# Patient Record
Sex: Male | Born: 1990 | Race: White | Hispanic: No | Marital: Single | Smoking: Current some day smoker
Health system: Southern US, Community
[De-identification: ages and names within clinical notes are randomized; demographics above are authoritative.]

## PROBLEM LIST (undated history)

## (undated) DIAGNOSIS — M7542 Impingement syndrome of left shoulder: Secondary | ICD-10-CM

## (undated) DIAGNOSIS — K219 Gastro-esophageal reflux disease without esophagitis: Secondary | ICD-10-CM

## (undated) DIAGNOSIS — S43005A Unspecified dislocation of left shoulder joint, initial encounter: Secondary | ICD-10-CM

## (undated) DIAGNOSIS — M24119 Other articular cartilage disorders, unspecified shoulder: Secondary | ICD-10-CM

## (undated) HISTORY — PX: UPPER GI ENDOSCOPY: SHX6162

---

## 2013-04-11 ENCOUNTER — Encounter: Payer: Self-pay | Admitting: Sports Medicine

## 2013-04-11 ENCOUNTER — Ambulatory Visit (INDEPENDENT_AMBULATORY_CARE_PROVIDER_SITE_OTHER): Payer: BC Managed Care – PPO | Admitting: Sports Medicine

## 2013-04-11 ENCOUNTER — Ambulatory Visit
Admission: RE | Admit: 2013-04-11 | Discharge: 2013-04-11 | Disposition: A | Discharge status: Home / Self Care | Payer: BC Managed Care – PPO | Source: Ambulatory Visit | Attending: Sports Medicine | Admitting: Sports Medicine

## 2013-04-11 VITALS — BP 118/72 | Ht 73.0 in | Wt 203.0 lb

## 2013-04-11 DIAGNOSIS — M25519 Pain in unspecified shoulder: Secondary | ICD-10-CM

## 2013-04-11 DIAGNOSIS — S43002A Unspecified subluxation of left shoulder joint, initial encounter: Secondary | ICD-10-CM

## 2013-04-11 DIAGNOSIS — S43006A Unspecified dislocation of unspecified shoulder joint, initial encounter: Secondary | ICD-10-CM

## 2013-04-11 DIAGNOSIS — M25512 Pain in left shoulder: Secondary | ICD-10-CM

## 2013-04-11 NOTE — Patient Instructions (Signed)
PRIOR AUTH NUMBER IS [id number]

## 2013-04-11 NOTE — Progress Notes (Signed)
   Subjective:    Patient ID: Randall Mccullough, male    DOB: 12/30/1990, 23 y.o.   MRN: [id number]  HPI chief complaint: Left shoulder pain  Very pleasant right-hand-dominant 23 year old male comes in today complaining of 1 1/2 months of left shoulder pain. He injured the shoulder while lifting weights. While doing an overhead press he felt his left shoulder sublux inferiorly. Spontaneous relocation. Since then, he's had pain with activity, particularly with weight lifting. Pain at night as well. No locking or catching. No numbness or tingling. He has had several previous subluxations in the same shoulder. No prior shoulder surgeries but he did suffer a sternoclavicular subluxation of this same shoulder previously. No history of subluxations or dislocations of the right shoulder. He takes an occasional over-the-counter pain medication. He has not had any imaging. He has not tried physical therapy. He has not been prescribed any specific medicines for this problem. He is referred by his primary care physician for workup and treatment.  Otherwise healthy He is allergic to sulfa drug No chronic medications Socially, drinks alcohol on occasion and works as a Systems analystpersonal trainer for [employer].    Review of Systems As above.     Objective:   Physical Exam Well-developed, fit-appearing. No acute distress. Awake alert oriented x3. Vital signs are reviewed.  Left shoulder: Full range of motion. No tenderness along the clavicle or over the a.c. Joint. Mild prominence of the Emmons joint but nontender to palpation. No tenderness over the bicipital groove. Rotator cuff strength is 5/5. Negative empty can, negative Hawkins. Positive O'Brien's, positive apprehension. Mildly positive sulcus. Neurovascularly intact distally.  Right shoulder: Full range of motion. No tenderness along the clavicle or over the a.c. joint. Rotator cuff strength is 5/5. Negative O'Brien, negative apprehension. Neurovascularly intact  distally.  X-rays of the left shoulder including an axillary view are reviewed. No evidence of a bony Bankart or Hill-Sachs deformity. Films are unremarkable.       Assessment & Plan:  Left shoulder pain secondary to reoccurring subluxations-rule out labral tear  I discussed the patient's treatment options with him. We will pursue an MRI arthrogram of the left shoulder specifically to evaluate for any labral pathology which may need operative intervention. I will call him with those results once available at which point we will delineate further treatment. In the meantime, he needs to refrain from heavy overhead lifting.

## 2013-04-23 ENCOUNTER — Ambulatory Visit
Admission: RE | Admit: 2013-04-23 | Discharge: 2013-04-23 | Disposition: A | Discharge status: Home / Self Care | Payer: BC Managed Care – PPO | Source: Ambulatory Visit | Attending: Sports Medicine | Admitting: Sports Medicine

## 2013-04-23 DIAGNOSIS — M25512 Pain in left shoulder: Secondary | ICD-10-CM

## 2013-04-23 MED ORDER — IOHEXOL 180 MG/ML  SOLN
15.0000 mL | Freq: Once | INTRAMUSCULAR | Status: AC | PRN
  Administered 2013-04-23: 15 mL via INTRA_ARTICULAR

## 2013-04-24 ENCOUNTER — Telehealth: Payer: Self-pay | Admitting: Sports Medicine

## 2013-04-24 ENCOUNTER — Encounter: Payer: Self-pay | Admitting: *Deleted

## 2013-04-24 NOTE — Telephone Encounter (Signed)
I spoke with the patient on the phone today regarding MRI arthrogram findings of his left shoulder. MRI arthrogram shows evidence of an anterior inferior labral tear with partial detachment along with a full-thickness delamination injury involving the adjacent articular cartilage (GLAD lesion). Rotator cuff is intact. He also has evidence of posttraumatic AC joint arthropathy. Based on these findings I recommended surgical consultation with Dr. Dion SaucierLandau to discuss surgical options. Patient is in agreement. I'll defer further workup and treatment to the discretion of Dr. Dion SaucierLandau and the patient will followup with me when necessary.

## 2013-04-24 NOTE — Patient Instructions (Signed)
DR LANDAU MON APR 13TH AT 9A [address] [facility] [phone]

## 2013-06-17 ENCOUNTER — Other Ambulatory Visit: Payer: Self-pay | Admitting: Orthopedic Surgery

## 2013-06-17 DIAGNOSIS — M24119 Other articular cartilage disorders, unspecified shoulder: Secondary | ICD-10-CM

## 2013-06-17 DIAGNOSIS — M7542 Impingement syndrome of left shoulder: Secondary | ICD-10-CM

## 2013-06-17 HISTORY — DX: Other articular cartilage disorders, unspecified shoulder: M24.119

## 2013-06-17 HISTORY — DX: Impingement syndrome of left shoulder: M75.42

## 2013-06-24 ENCOUNTER — Encounter (HOSPITAL_BASED_OUTPATIENT_CLINIC_OR_DEPARTMENT_OTHER): Payer: Self-pay | Admitting: *Deleted

## 2013-06-28 ENCOUNTER — Encounter (HOSPITAL_BASED_OUTPATIENT_CLINIC_OR_DEPARTMENT_OTHER): Payer: Self-pay | Admitting: Anesthesiology

## 2013-06-28 ENCOUNTER — Ambulatory Visit (HOSPITAL_BASED_OUTPATIENT_CLINIC_OR_DEPARTMENT_OTHER)
Admission: RE | Admit: 2013-06-28 | Discharge: 2013-06-28 | Disposition: A | Discharge status: Home / Self Care | Payer: BC Managed Care – PPO | Source: Ambulatory Visit | Attending: Orthopedic Surgery | Admitting: Orthopedic Surgery

## 2013-06-28 ENCOUNTER — Encounter (HOSPITAL_BASED_OUTPATIENT_CLINIC_OR_DEPARTMENT_OTHER): Payer: BC Managed Care – PPO | Admitting: Anesthesiology

## 2013-06-28 ENCOUNTER — Ambulatory Visit (HOSPITAL_BASED_OUTPATIENT_CLINIC_OR_DEPARTMENT_OTHER): Payer: BC Managed Care – PPO | Admitting: Anesthesiology

## 2013-06-28 ENCOUNTER — Encounter (HOSPITAL_BASED_OUTPATIENT_CLINIC_OR_DEPARTMENT_OTHER)
Admission: RE | Disposition: A | Discharge status: Home / Self Care | Payer: Self-pay | Source: Ambulatory Visit | Attending: Orthopedic Surgery

## 2013-06-28 DIAGNOSIS — S43005A Unspecified dislocation of left shoulder joint, initial encounter: Secondary | ICD-10-CM | POA: Diagnosis present

## 2013-06-28 DIAGNOSIS — M19019 Primary osteoarthritis, unspecified shoulder: Secondary | ICD-10-CM | POA: Diagnosis present

## 2013-06-28 DIAGNOSIS — F17200 Nicotine dependence, unspecified, uncomplicated: Secondary | ICD-10-CM | POA: Insufficient documentation

## 2013-06-28 DIAGNOSIS — M249 Joint derangement, unspecified: Secondary | ICD-10-CM | POA: Insufficient documentation

## 2013-06-28 HISTORY — DX: Unspecified dislocation of left shoulder joint, initial encounter: S43.005A

## 2013-06-28 HISTORY — DX: Other articular cartilage disorders, unspecified shoulder: M24.119

## 2013-06-28 HISTORY — DX: Impingement syndrome of left shoulder: M75.42

## 2013-06-28 HISTORY — DX: Gastro-esophageal reflux disease without esophagitis: K21.9

## 2013-06-28 LAB — POCT HEMOGLOBIN-HEMACUE: HEMOGLOBIN: 14.9 g/dL (ref 13.0–17.0)

## 2013-06-28 SURGERY — SHOULDER ATHROSCOPY WITH CAPSULORRHAPHY
Anesthesia: Regional | Site: Shoulder | Laterality: Left

## 2013-06-28 MED ORDER — SENNA-DOCUSATE SODIUM 8.6-50 MG PO TABS: 2.0000 | ORAL_TABLET | Freq: Every day | ORAL | Status: AC

## 2013-06-28 MED ORDER — ONDANSETRON HCL 4 MG/2ML IJ SOLN
INTRAMUSCULAR | Status: DC | PRN
  Administered 2013-06-28: 4 mg via INTRAVENOUS

## 2013-06-28 MED ORDER — DEXAMETHASONE SODIUM PHOSPHATE 4 MG/ML IJ SOLN
INTRAMUSCULAR | Status: DC | PRN
  Administered 2013-06-28: 10 mg via INTRAVENOUS

## 2013-06-28 MED ORDER — MIDAZOLAM HCL 2 MG/2ML IJ SOLN
INTRAMUSCULAR | Status: AC
  Filled 2013-06-28: qty 2

## 2013-06-28 MED ORDER — FENTANYL CITRATE 0.05 MG/ML IJ SOLN
INTRAMUSCULAR | Status: DC | PRN
  Administered 2013-06-28 (×2): 25 ug via INTRAVENOUS

## 2013-06-28 MED ORDER — HYDROMORPHONE HCL PF 1 MG/ML IJ SOLN
INTRAMUSCULAR | Status: AC
  Filled 2013-06-28: qty 1

## 2013-06-28 MED ORDER — OXYCODONE HCL 5 MG/5ML PO SOLN: 5.0000 mg | Freq: Once | ORAL | Status: DC | PRN

## 2013-06-28 MED ORDER — OXYCODONE-ACETAMINOPHEN 10-325 MG PO TABS
1.0000 | ORAL_TABLET | Freq: Four times a day (QID) | ORAL | Status: AC | PRN
Start: 2013-06-28 — End: ?

## 2013-06-28 MED ORDER — CEFAZOLIN SODIUM-DEXTROSE 2-3 GM-% IV SOLR
INTRAVENOUS | Status: AC
  Filled 2013-06-28: qty 50

## 2013-06-28 MED ORDER — BUPIVACAINE HCL (PF) 0.5 % IJ SOLN
INTRAMUSCULAR | Status: AC
  Filled 2013-06-28: qty 30

## 2013-06-28 MED ORDER — SODIUM CHLORIDE 0.9 % IR SOLN
Status: DC | PRN
  Administered 2013-06-28: 3

## 2013-06-28 MED ORDER — BUPIVACAINE-EPINEPHRINE (PF) 0.5% -1:200000 IJ SOLN
INTRAMUSCULAR | Status: DC | PRN
  Administered 2013-06-28: 25 mL via PERINEURAL

## 2013-06-28 MED ORDER — FENTANYL CITRATE 0.05 MG/ML IJ SOLN
50.0000 ug | INTRAMUSCULAR | Status: DC | PRN
  Administered 2013-06-28: 100 ug via INTRAVENOUS

## 2013-06-28 MED ORDER — SUCCINYLCHOLINE CHLORIDE 20 MG/ML IJ SOLN
INTRAMUSCULAR | Status: DC | PRN
Start: 2013-06-28 — End: 2013-06-28
  Administered 2013-06-28: 100 mg via INTRAVENOUS

## 2013-06-28 MED ORDER — MIDAZOLAM HCL 2 MG/ML PO SYRP: 12.0000 mg | ORAL_SOLUTION | Freq: Once | ORAL | Status: DC | PRN

## 2013-06-28 MED ORDER — PROPOFOL 10 MG/ML IV BOLUS
INTRAVENOUS | Status: DC | PRN
  Administered 2013-06-28: 200 mg via INTRAVENOUS

## 2013-06-28 MED ORDER — BUPIVACAINE-EPINEPHRINE (PF) 0.5% -1:200000 IJ SOLN
INTRAMUSCULAR | Status: AC
  Filled 2013-06-28: qty 30

## 2013-06-28 MED ORDER — ONDANSETRON HCL 4 MG/2ML IJ SOLN: INTRAMUSCULAR | Status: DC | PRN

## 2013-06-28 MED ORDER — HYDROMORPHONE HCL PF 1 MG/ML IJ SOLN: 0.2500 mg | INTRAMUSCULAR | Status: DC | PRN

## 2013-06-28 MED ORDER — LIDOCAINE HCL (CARDIAC) 20 MG/ML IV SOLN
INTRAVENOUS | Status: DC | PRN
  Administered 2013-06-28: 50 mg via INTRAVENOUS

## 2013-06-28 MED ORDER — LACTATED RINGERS IV SOLN
INTRAVENOUS | Status: DC
  Administered 2013-06-28 (×2): via INTRAVENOUS

## 2013-06-28 MED ORDER — METOCLOPRAMIDE HCL 5 MG/ML IJ SOLN: 10.0000 mg | Freq: Once | INTRAMUSCULAR | Status: DC | PRN

## 2013-06-28 MED ORDER — MIDAZOLAM HCL 2 MG/2ML IJ SOLN
1.0000 mg | INTRAMUSCULAR | Status: DC | PRN
  Administered 2013-06-28: 2 mg via INTRAVENOUS

## 2013-06-28 MED ORDER — METHOCARBAMOL 500 MG PO TABS: 500.0000 mg | ORAL_TABLET | Freq: Four times a day (QID) | ORAL | Status: AC

## 2013-06-28 MED ORDER — OXYCODONE HCL 5 MG PO TABS: 5.0000 mg | ORAL_TABLET | Freq: Once | ORAL | Status: DC | PRN

## 2013-06-28 MED ORDER — CEFAZOLIN SODIUM-DEXTROSE 2-3 GM-% IV SOLR
2.0000 g | INTRAVENOUS | Status: AC
  Administered 2013-06-28: 2 g via INTRAVENOUS

## 2013-06-28 MED ORDER — LIDOCAINE HCL 4 % MT SOLN
OROMUCOSAL | Status: DC | PRN
  Administered 2013-06-28: 3 mL via TOPICAL

## 2013-06-28 MED ORDER — BUPIVACAINE HCL (PF) 0.25 % IJ SOLN
INTRAMUSCULAR | Status: AC
  Filled 2013-06-28: qty 30

## 2013-06-28 MED ORDER — FENTANYL CITRATE 0.05 MG/ML IJ SOLN
INTRAMUSCULAR | Status: AC
  Filled 2013-06-28: qty 2

## 2013-06-28 MED ORDER — PROMETHAZINE HCL 25 MG PO TABS: 25.0000 mg | ORAL_TABLET | Freq: Four times a day (QID) | ORAL | Status: AC | PRN

## 2013-06-28 SURGICAL SUPPLY — 68 items
ANCHOR SUT BIOCOMP LK 2.9X12.5 (Anchor) ×6 IMPLANT
BENZOIN TINCTURE PRP APPL 2/3 (GAUZE/BANDAGES/DRESSINGS) ×3 IMPLANT
BLADE CUTTER GATOR 3.5 (BLADE) ×3 IMPLANT
BLADE GREAT WHITE 4.2 (BLADE) IMPLANT
BLADE GREAT WHITE 4.2MM (BLADE)
BLADE SURG 15 STRL LF DISP TIS (BLADE) IMPLANT
BLADE SURG 15 STRL SS (BLADE)
BUR OVAL 4.0 (BURR) IMPLANT
BUR OVAL 6.0 (BURR) ×3 IMPLANT
CANNULA 5.75X71 LONG (CANNULA) ×3 IMPLANT
CANNULA TWIST IN 8.25X7CM (CANNULA) ×3 IMPLANT
CANNULA TWIST IN 8.25X9CM (CANNULA) IMPLANT
CLOSURE WOUND 1/2 X4 (GAUZE/BANDAGES/DRESSINGS) ×1
DECANTER SPIKE VIAL GLASS SM (MISCELLANEOUS) IMPLANT
DRAPE INCISE IOBAN 66X45 STRL (DRAPES) ×3 IMPLANT
DRAPE SHOULDER BEACH CHAIR (DRAPES) ×3 IMPLANT
DRAPE U 20/CS (DRAPES) ×3 IMPLANT
DRAPE U-SHAPE 47X51 STRL (DRAPES) ×3 IMPLANT
DRSG PAD ABDOMINAL 8X10 ST (GAUZE/BANDAGES/DRESSINGS) ×3 IMPLANT
DURAPREP 26ML APPLICATOR (WOUND CARE) ×3 IMPLANT
ELECT REM PT RETURN 9FT ADLT (ELECTROSURGICAL) ×3
ELECTRODE REM PT RTRN 9FT ADLT (ELECTROSURGICAL) ×1 IMPLANT
FIBERSTICK 2 (SUTURE) IMPLANT
GAUZE SPONGE 4X4 12PLY STRL (GAUZE/BANDAGES/DRESSINGS) ×3 IMPLANT
GLOVE BIO SURGEON STRL SZ8 (GLOVE) ×6 IMPLANT
GLOVE BIOGEL PI IND STRL 8 (GLOVE) ×3 IMPLANT
GLOVE BIOGEL PI INDICATOR 8 (GLOVE) ×6
GLOVE ORTHO TXT STRL SZ7.5 (GLOVE) ×3 IMPLANT
GOWN STRL REUS W/ TWL LRG LVL3 (GOWN DISPOSABLE) ×1 IMPLANT
GOWN STRL REUS W/ TWL XL LVL3 (GOWN DISPOSABLE) ×3 IMPLANT
GOWN STRL REUS W/TWL LRG LVL3 (GOWN DISPOSABLE) ×2
GOWN STRL REUS W/TWL XL LVL3 (GOWN DISPOSABLE) ×6
IMMOBILIZER SHOULDER FOAM XLGE (SOFTGOODS) IMPLANT
IV NS IRRIG 3000ML ARTHROMATIC (IV SOLUTION) ×9 IMPLANT
KIT PUSHLOCK 2.9 HIP (KITS) ×6 IMPLANT
KIT SHOULDER TRACTION (DRAPES) ×3 IMPLANT
LASSO 90 CVE QUICKPAS (DISPOSABLE) ×3 IMPLANT
MANIFOLD NEPTUNE II (INSTRUMENTS) ×3 IMPLANT
NEEDLE SCORPION MULTI FIRE (NEEDLE) IMPLANT
PACK ARTHROSCOPY DSU (CUSTOM PROCEDURE TRAY) ×3 IMPLANT
PACK BASIN DAY SURGERY FS (CUSTOM PROCEDURE TRAY) ×3 IMPLANT
SET ARTHROSCOPY TUBING (MISCELLANEOUS) ×2
SET ARTHROSCOPY TUBING LN (MISCELLANEOUS) ×1 IMPLANT
SHEET MEDIUM DRAPE 40X70 STRL (DRAPES) ×3 IMPLANT
SLEEVE SCD COMPRESS KNEE MED (MISCELLANEOUS) ×3 IMPLANT
SLING ARM IMMOBILIZER LRG (SOFTGOODS) ×3 IMPLANT
SLING ARM IMMOBILIZER MED (SOFTGOODS) IMPLANT
SLING ARM LRG ADULT FOAM STRAP (SOFTGOODS) IMPLANT
SLING ARM MED ADULT FOAM STRAP (SOFTGOODS) IMPLANT
SLING ARM XL FOAM STRAP (SOFTGOODS) IMPLANT
STRIP CLOSURE SKIN 1/2X4 (GAUZE/BANDAGES/DRESSINGS) ×2 IMPLANT
SUT FIBERWIRE #2 38 T-5 BLUE (SUTURE)
SUT MNCRL AB 4-0 PS2 18 (SUTURE) IMPLANT
SUT PDS AB 1 CT  36 (SUTURE)
SUT PDS AB 1 CT 36 (SUTURE) IMPLANT
SUT TIGER TAPE 7 IN WHITE (SUTURE) IMPLANT
SUT VIC AB 3-0 SH 27 (SUTURE)
SUT VIC AB 3-0 SH 27X BRD (SUTURE) IMPLANT
SUTURE FIBERWR #2 38 T-5 BLUE (SUTURE) IMPLANT
TAPE FIBER 2MM 7IN #2 BLUE (SUTURE) IMPLANT
TAPE LABRALWHITE 1.5X36 (TAPE) ×3 IMPLANT
TAPE SUT LABRALTAP WHT/BLK (SUTURE) ×3 IMPLANT
TOWEL OR 17X24 6PK STRL BLUE (TOWEL DISPOSABLE) ×3 IMPLANT
TOWEL OR NON WOVEN STRL DISP B (DISPOSABLE) ×6 IMPLANT
TUBE CONNECTING 20'X1/4 (TUBING)
TUBE CONNECTING 20X1/4 (TUBING) IMPLANT
WAND STAR VAC 90 (SURGICAL WAND) ×3 IMPLANT
WATER STERILE IRR 1000ML POUR (IV SOLUTION) ×3 IMPLANT

## 2013-06-28 NOTE — H&P (Signed)
PREOPERATIVE H&P  Chief Complaint: Left shoulder instability and pain  HPI: Randall Mccullough is a 23 y.o. male who presents for preoperative history and physical with a diagnosis of left shoulder instability with labral tear, and a.c. joint arthrosis . Symptoms are rated as moderate to severe, and have been worsening.  This is significantly impairing activities of daily living.  He has elected for surgical management.   Past Medical History  Diagnosis Date  . GERD (gastroesophageal reflux disease)     no current med.  . 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 Impingement syndrome of left shoulder 06/2013   Past Surgical History  Procedure Laterality Date  . Upper gi endoscopy     History   Social History  . Marital Status: Single    Spouse Name: N/A    Number of Children: N/A  . Years of Education: N/A   Social History Main Topics  . Smoking status: Current Some Day Smoker  . Smokeless tobacco: Never Used     Comment: hookah smoker - 1 x/week  . Alcohol Use: Yes     Comment: occasionally  . Drug Use: No  . Sexual Activity: None   Other Topics Concern  . None   Social History Narrative  . None   History reviewed. No pertinent family history. Allergies  Allergen Reactions  . Sulfa Antibiotics Other (See Comments)    UNKNOWN - AS A CHILD   Prior to Admission medications   Not on File     Positive ROS: All other systems have been reviewed and were otherwise negative with the exception of those mentioned in the HPI and as above.  Physical Exam: General: Alert, no acute distress Cardiovascular: No pedal edema Respiratory: No cyanosis, no use of accessory musculature GI: No organomegaly, abdomen is soft and non-tender Skin: No lesions in the area of chief complaint Neurologic: Sensation intact distally Psychiatric: Patient is competent for consent with normal mood and affect Lymphatic: No axillary or cervical  lymphadenopathy  MUSCULOSKELETAL: left shoulder has pain with over the a.c. joint, positive apprehension, pain anteriorly. Essentially full motion, with intact strength.  Assessment:  left shoulder labral tear with possible instability and a.c. joint arthrosis and   Plan: Plan for Procedure(s): LEFT SHOULDER ARTHROSCOPY DEBRIDEMENT LIMITED,CAPSULORRHAPHY,DISTAL CLAVICULECTOMY  The risks benefits and alternatives were discussed with the patient including but not limited to the risks of nonoperative treatment, versus surgical intervention including infection, bleeding, nerve injury,  blood clots, cardiopulmonary complications, morbidity, mortality, among others, and they were willing to proceed.   Eulas PostLANDAU,[name], MD Cell [phone]   06/28/2013 9:54 AM

## 2013-06-28 NOTE — Anesthesia Preprocedure Evaluation (Signed)
Anesthesia Evaluation  Patient identified by MRN, date of birth, ID band Patient awake    Reviewed: Allergy & Precautions, H&P , NPO status , Patient's Chart, lab work & pertinent test results, reviewed documented beta blocker date and time   Airway Mallampati: II TM Distance: >3 FB Neck ROM: full    Dental   Pulmonary Current Smoker,  breath sounds clear to auscultation        Cardiovascular negative cardio ROS  Rhythm:regular     Neuro/Psych negative neurological ROS  negative psych ROS   GI/Hepatic negative GI ROS, Neg liver ROS, GERD-  Medicated and Controlled,  Endo/Other  negative endocrine ROS  Renal/GU negative Renal ROS  negative genitourinary   Musculoskeletal   Abdominal   Peds  Hematology negative hematology ROS (+)   Anesthesia Other Findings See surgeon's H&P   Reproductive/Obstetrics negative OB ROS                           Anesthesia Physical Anesthesia Plan  ASA: I  Anesthesia Plan: General   Post-op Pain Management:    Induction: Intravenous  Airway Management Planned: Oral ETT  Additional Equipment:   Intra-op Plan:   Post-operative Plan: Extubation in OR  Informed Consent: I have reviewed the patients History and Physical, chart, labs and discussed the procedure including the risks, benefits and alternatives for the proposed anesthesia with the patient or authorized representative who has indicated his/her understanding and acceptance.   Dental Advisory Given  Plan Discussed with: CRNA and Surgeon  Anesthesia Plan Comments:         Anesthesia Quick Evaluation

## 2013-06-28 NOTE — Progress Notes (Signed)
Assisted Dr. Frederick with left, ultrasound guided, interscalene  block. Side rails up, monitors on throughout procedure. See vital signs in flow sheet. Tolerated Procedure well. 

## 2013-06-28 NOTE — Discharge Instructions (Signed)
Diet: As you were doing prior to hospitalization  ° °Shower:  May shower but keep the wounds dry, use an occlusive plastic wrap, NO SOAKING IN TUB.  If the bandage gets wet, change with a clean dry gauze. ° °Dressing:  You may change your dressing 3-5 days after surgery.  Then change the dressing daily with sterile gauze dressing.   ° °There are sticky tapes (steri-strips) on your wounds and all the stitches are absorbable.  Leave the steri-strips in place when changing your dressings, they will peel off with time, usually 2-3 weeks. ° °Activity:  Increase activity slowly as tolerated, but follow the weight bearing instructions below.  No lifting or driving for 6 weeks. ° °Weight Bearing:   Sling at all times.   ° °To prevent constipation: you may use a stool softener such as - ° °Colace (over the counter) 100 mg by mouth twice a day  °Drink plenty of fluids (prune juice may be helpful) and high fiber foods °Miralax (over the counter) for constipation as needed.   ° °Itching:  If you experience itching with your medications, try taking only a single pain pill, or even half a pain pill at a time.  You may take up to 10 pain pills per day, and you can also use benadryl over the counter for itching or also to help with sleep.  ° °Precautions:  If you experience chest pain or shortness of breath - call 911 immediately for transfer to the hospital emergency department!! ° °If you develop a fever greater that 101 F, purulent drainage from wound, increased redness or drainage from wound, or calf pain -- Call the office at [phone]                                                °Follow- Up Appointment:  Please call for an appointment to be seen in 2 weeks [place] - [phone] ° ° ° ° °Regional Anesthesia Blocks ° °1. Numbness or the inability to move the "blocked" extremity may last from 3-48 hours after placement. The length of time depends on the medication injected and your individual response to the  medication. If the numbness is not going away after 48 hours, call your surgeon. ° °2. The extremity that is blocked will need to be protected until the numbness is gone and the  Strength has returned. Because you cannot feel it, you will need to take extra care to avoid injury. Because it may be weak, you may have difficulty moving it or using it. You may not know what position it is in without looking at it while the block is in effect. ° °3. For blocks in the legs and feet, returning to weight bearing and walking needs to be done carefully. You will need to wait until the numbness is entirely gone and the strength has returned. You should be able to move your leg and foot normally before you try and bear weight or walk. You will need someone to be with you when you first try to ensure you do not fall and possibly risk injury. ° °4. Bruising and tenderness at the needle site are common side effects and will resolve in a few days. ° °5. Persistent numbness or new problems with movement should be communicated to the surgeon or the [facility] ([phone])/ [facility] ([phone]). ° ° ° °Post Anesthesia Home Care Instructions ° °Activity: °Get plenty of rest for the remainder of the day. A responsible adult should stay with you for 24 hours following the procedure.  °For the next 24 hours, DO NOT: °-Drive a car °-Operate machinery °-Drink alcoholic beverages °-Take any medication unless instructed by your physician °-Make any legal decisions or sign important papers. ° °Meals: °Start with liquid foods such as gelatin or soup. Progress to regular foods as tolerated. Avoid greasy, spicy, heavy foods. If nausea and/or vomiting occur, drink only clear liquids until the nausea and/or vomiting subsides. Call your physician if vomiting continues. ° °Special Instructions/Symptoms: °Your throat may feel dry or sore from the anesthesia or the breathing tube placed in your throat during surgery.  If this causes discomfort, gargle with warm salt water. The discomfort should disappear within 24 hours. ° °

## 2013-06-28 NOTE — Anesthesia Postprocedure Evaluation (Signed)
Anesthesia Post Note  Patient: Randall Mccullough  Procedure(s) Performed: Procedure(s) (LRB): LEFT SHOULDER ARTHROSCOPY DEBRIDEMENT LIMITED,CAPSULORRHAPHY,DISTAL CLAVICULECTOMY (Left)  Anesthesia type: General  Patient location: PACU  Post pain: Pain level controlled  Post assessment: Patient's Cardiovascular Status Stable  Last Vitals:  Filed Vitals:   06/28/13 1315  BP: 115/69  Pulse: 53  Temp:   Resp: 16    Post vital signs: Reviewed and stable  Level of consciousness: alert  Complications: No apparent anesthesia complications

## 2013-06-28 NOTE — Anesthesia Procedure Notes (Addendum)
Anesthesia Regional Block:  Interscalene brachial plexus block  Pre-Anesthetic Checklist: ,, timeout performed, Correct Patient, Correct Site, Correct Laterality, Correct Procedure, Correct Position, site marked, Risks and benefits discussed,  Surgical consent,  Pre-op evaluation,  At surgeon's request and post-op pain management  Laterality: Left  Prep: chloraprep       Needles:   Needle Type: Other     Needle Length: 9cm 9 cm Needle Gauge: 21 and 21 G    Additional Needles:  Procedures: ultrasound guided (picture in chart) Interscalene brachial plexus block Narrative:  Start time: 06/28/2013 8:30 AM End time: 06/28/2013 8:36 AM Injection made incrementally with aspirations every 5 mL.  Performed by: Personally  Anesthesiologist: Aldona Lento Frederick, MD  Additional Notes: Ultrasound guidance used to: id relevant anatomy, confirm needle position, local anesthetic spread, avoidance of vascular puncture. Picture saved. No complications. Block performed personally by Janetta Horaharles E. Gelene MinkFrederick, MD     Procedure Name: Intubation Date/Time: 06/28/2013 10:26 AM Performed by: Burna CashONRAD, Humbert Morozov C Pre-anesthesia Checklist: Patient identified, Emergency Drugs available, Suction available and Patient being monitored Patient Re-evaluated:Patient Re-evaluated prior to inductionOxygen Delivery Method: Circle System Utilized Preoxygenation: Pre-oxygenation with 100% oxygen Intubation Type: IV induction Ventilation: Mask ventilation without difficulty Laryngoscope Size: Mac and 3 Grade View: Grade I Tube type: Oral Tube size: 8.0 mm Number of attempts: 1 Airway Equipment and Method: stylet and oral airway Placement Confirmation: ETT inserted through vocal cords under direct vision,  positive ETCO2 and breath sounds checked- equal and bilateral Secured at: 22 cm Tube secured with: Tape Dental Injury: Teeth and Oropharynx as per pre-operative assessment

## 2013-06-28 NOTE — Op Note (Signed)
06/28/2013  12:13 PM  PATIENT:  Randall Mccullough    PRE-OPERATIVE DIAGNOSIS:  Left shoulder labrum tear, anterior instability, a.c. joint arthritis  POST-OPERATIVE DIAGNOSIS:  Same with small partial undersurface thickness supraspinatus tear  PROCEDURE:  Left shoulder arthroscopy, extensive debridement, Bankart repair, distal clavicle resection  SURGEON:  Eulas PostLANDAU,Keon Benscoter P, MD  PHYSICIAN ASSISTANT: Janace LittenBrandon Parry, OPA-C, present and scrubbed throughout the case, critical for completion in a timely fashion, and for retraction, instrumentation, and closure.  ANESTHESIA:   General  PREOPERATIVE INDICATIONS:  Randall KnucklesChristian Ruthann Cancerracy Mccullough is a  23 y.o. male who had a previous left shoulder dislocation, with an MRI demonstrated a labral tear, and also complained of a.c. joint arthrosis, and he was a fairly avid weight lifter, and elected for surgical management after extensive nonsurgical treatment.  The risks benefits and alternatives were discussed with the patient preoperatively including but not limited to the risks of infection, bleeding, nerve injury, cardiopulmonary complications, the need for revision surgery, among others, and the patient was willing to proceed. We also discussed the risks of recurrent instability, incomplete relief of pain, among others.  OPERATIVE IMPLANTS: Arthrex bio composite 2.9 mm short push lock anchors x2. I used a total of 2 #2 labral fiber tapes in an inverted horizontal mattress configuration inferiorly, and simple configuration superiorly..   OPERATIVE FINDINGS: The shoulder was not totally grossly unstable anteriorly, but did have an abnormal click and a wide with posterior loading. He had about 10% glenoid bone loss anteriorly, and anterior labral tear that extended from about the 10:00 position down all the way to the 5:00 position posteriorly. There was a loose piece of labrum that was torn and was flipping in and out of the joint. There was also a 5% undersurface  supraspinatus tear, this is really more of a capsular tear beneath the cable. The biceps tendon was normal and the superior labrum was normal. Posterior labrum was intact without tearing.  OPERATIVE PROCEDURE: The patient was brought to the operating room and placed in the supine position. General anesthesia was administered. IV antibiotics were given. General anesthesia was administered.   The upper extremity was examined and found to have some instability to anterior testing, although I was able to shifting posteriorly, but the most striking finding was a grinding with loading and shifting. The upper extremity was prepped and draped in the usual sterile fashion. The patient was in a semilateral decubitus position.  Time out was performed. Diagnostic arthroscopy was carried out the above-named findings.   I placed 2 anterior cannulas, and then mobilized the labrum off of the medial neck of the glenoid with the spatula, although it rib was really not all that much torn off of the glenoid, this really seemed much more of a large labral tear, with combined instability, although it did not completely detached labrum from the scapula.  I then prepared the glenoid rim with a shaver/rasp to optimize healing, while still preserving the anterior bone stock.  The labrum had reasonable mobility.   I then used a suture passer to pass an inverted labral fiber tape on either side of the inferior anterior glenohumeral ligament. This had excellent purchase on the tissue.  I anchored the anterior inferior glenohumeral ligament into the glenoid using a push lock anchor.   I then placed a second suture slightly superiorly.  This was anchored above the 3:00 position using a push lock.   Excellent soft tissue restoration of tension was achieved, and the arthroscopic cannulas were  removed, and I then turned to the subacromial space. The rotator cuff from above was pristine, the CA ligament was totally intact with no  evidence for bursitis or impingement syndrome. His a.c. joint however did have significant degenerative changes, and I did not release the CA ligament, but did expose the a.c. joint, and then resected about 1 cm of distal clavicle. This was confirmed from multiple portals.  I did have to make a more medial portals and the instrumentation portal for the Bankart repair, so he had an extra anterior incision.  The portals were then closed with Monocryl followed by Steri-Strips and sterile gauze. The patient was awakened and returned to the PACU in stable and satisfactory condition. There were no complications and the patient tolerated the procedure well.

## 2013-06-28 NOTE — Transfer of Care (Signed)
Immediate Anesthesia Transfer of Care Note  Patient: Randall Mccullough  Procedure(s) Performed: Procedure(s): LEFT SHOULDER ARTHROSCOPY DEBRIDEMENT LIMITED,CAPSULORRHAPHY,DISTAL CLAVICULECTOMY (Left)  Patient Location: PACU  Anesthesia Type:GA combined with regional for post-op pain  Level of Consciousness: awake, alert  and oriented  Airway & Oxygen Therapy: Patient Spontanous Breathing and Patient connected to face mask oxygen  Post-op Assessment: Report given to PACU RN and Post -op Vital signs reviewed and stable  Post vital signs: Reviewed and stable  Complications: No apparent anesthesia complications

## 2016-02-28 IMAGING — CR DG SHOULDER 2+V*L*
3 series · 3 of 3 positions shown · non-contrast
Comparison: None.

CLINICAL DATA: Left shoulder pain.

EXAM:
LEFT SHOULDER - 2+ VIEW

[view not recorded (1 of 3)]
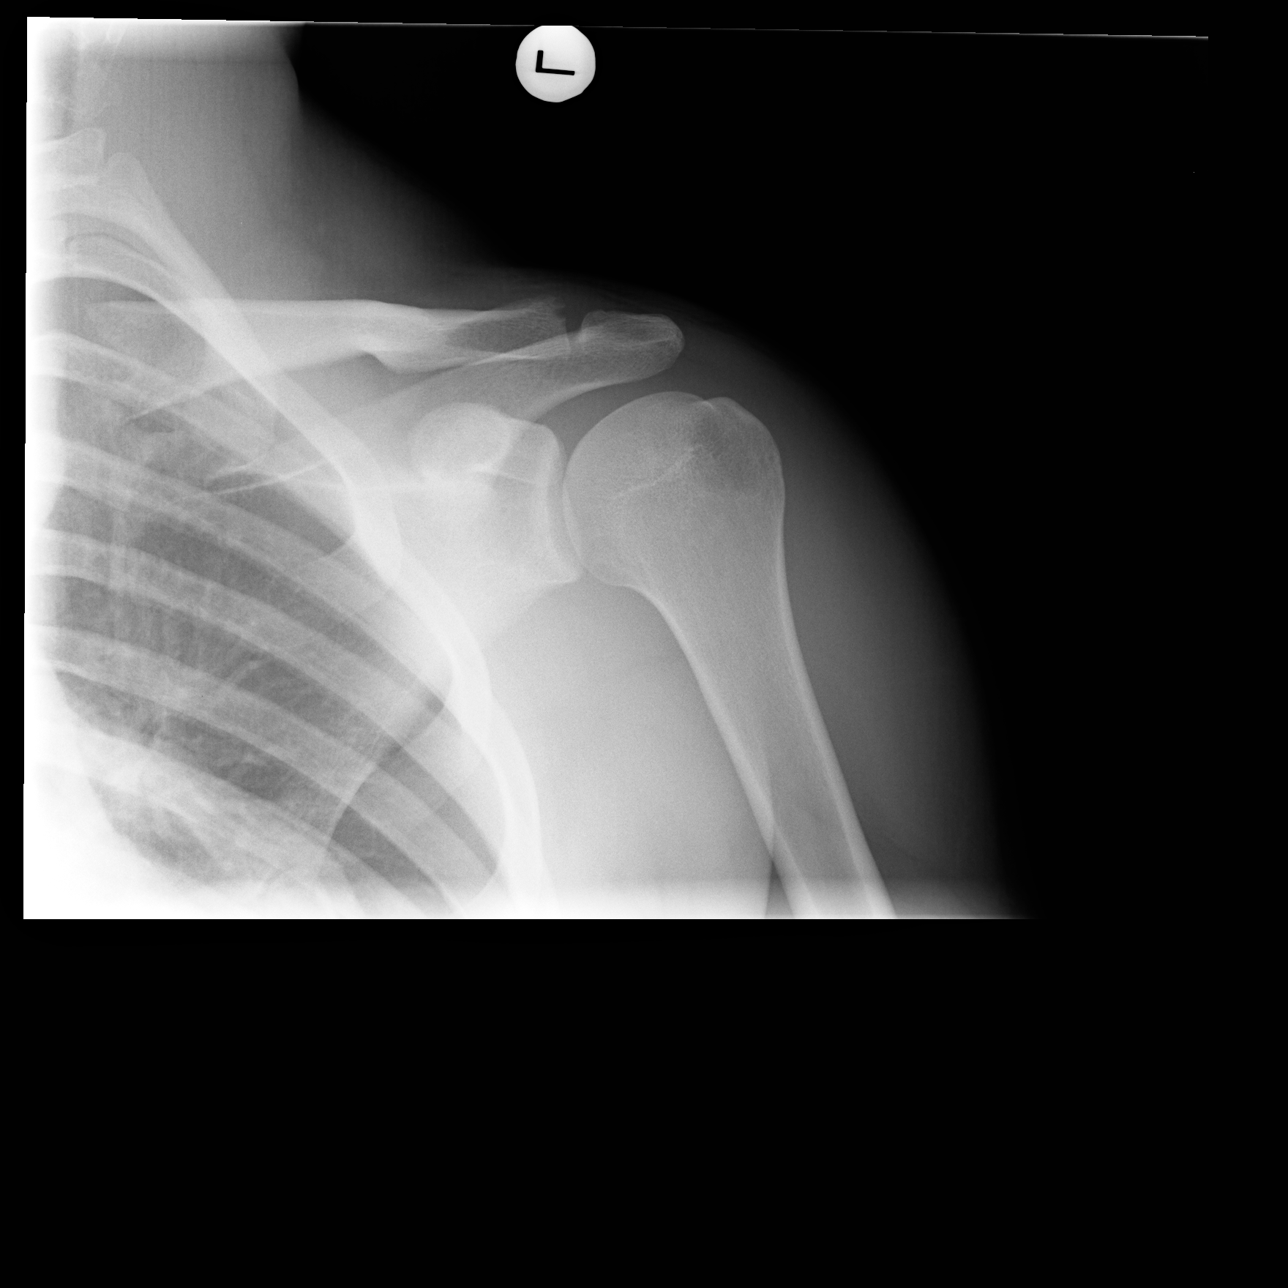

[view not recorded (2 of 3)]
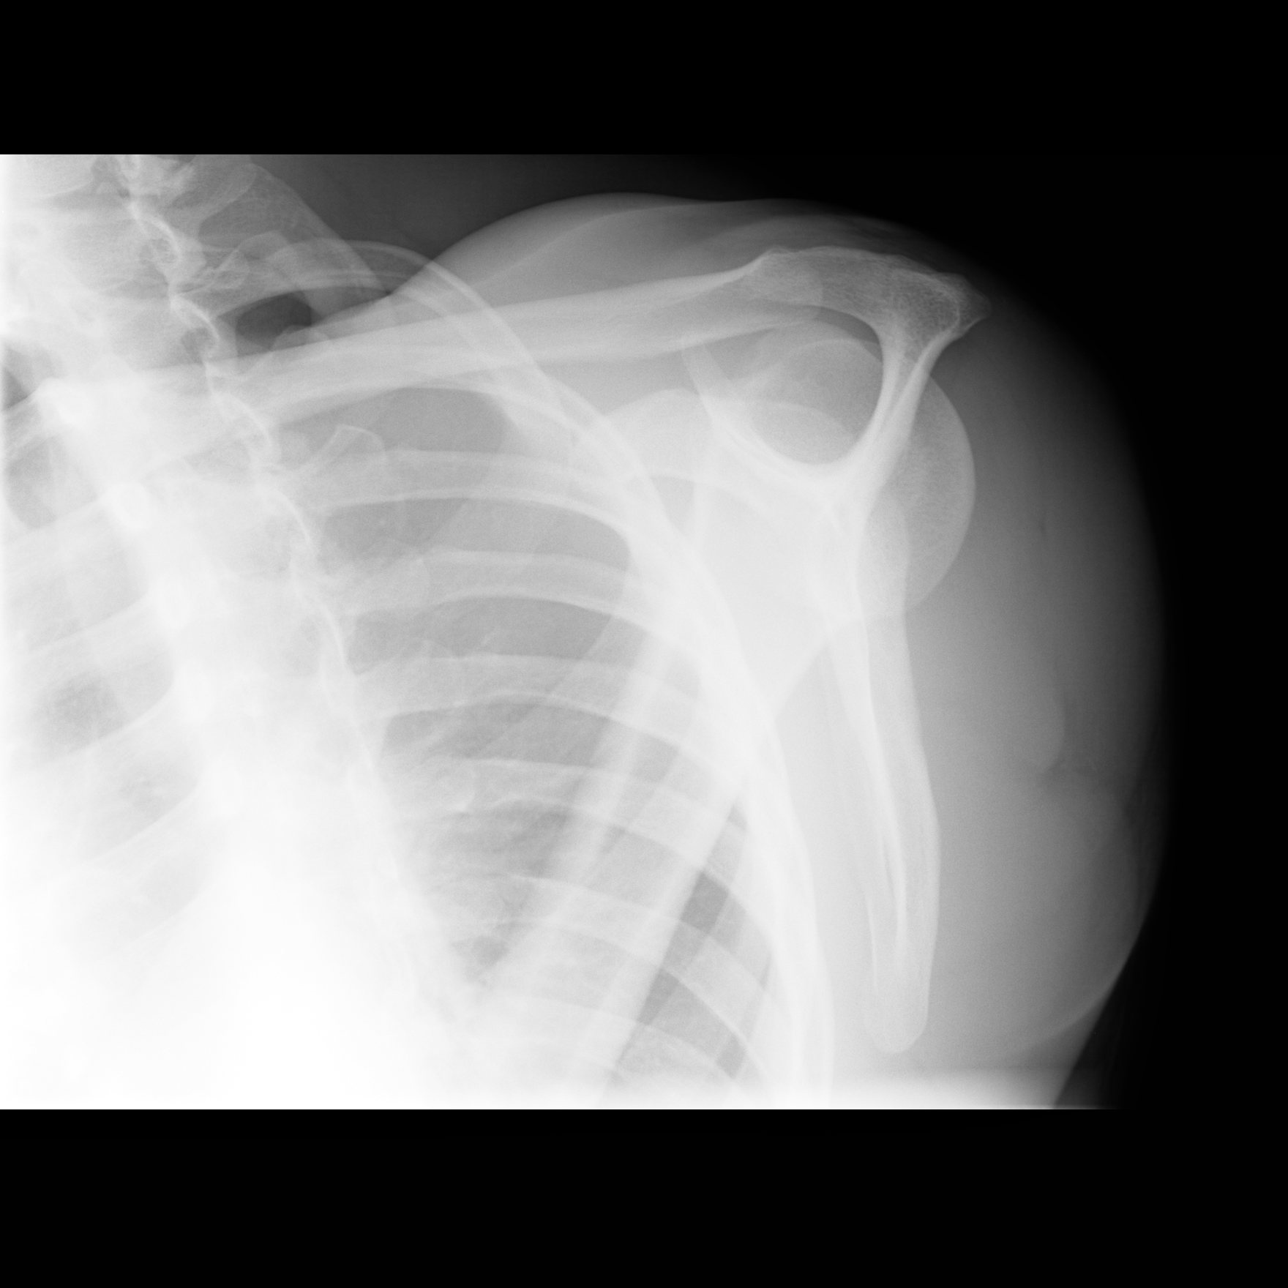

[view not recorded (3 of 3)]
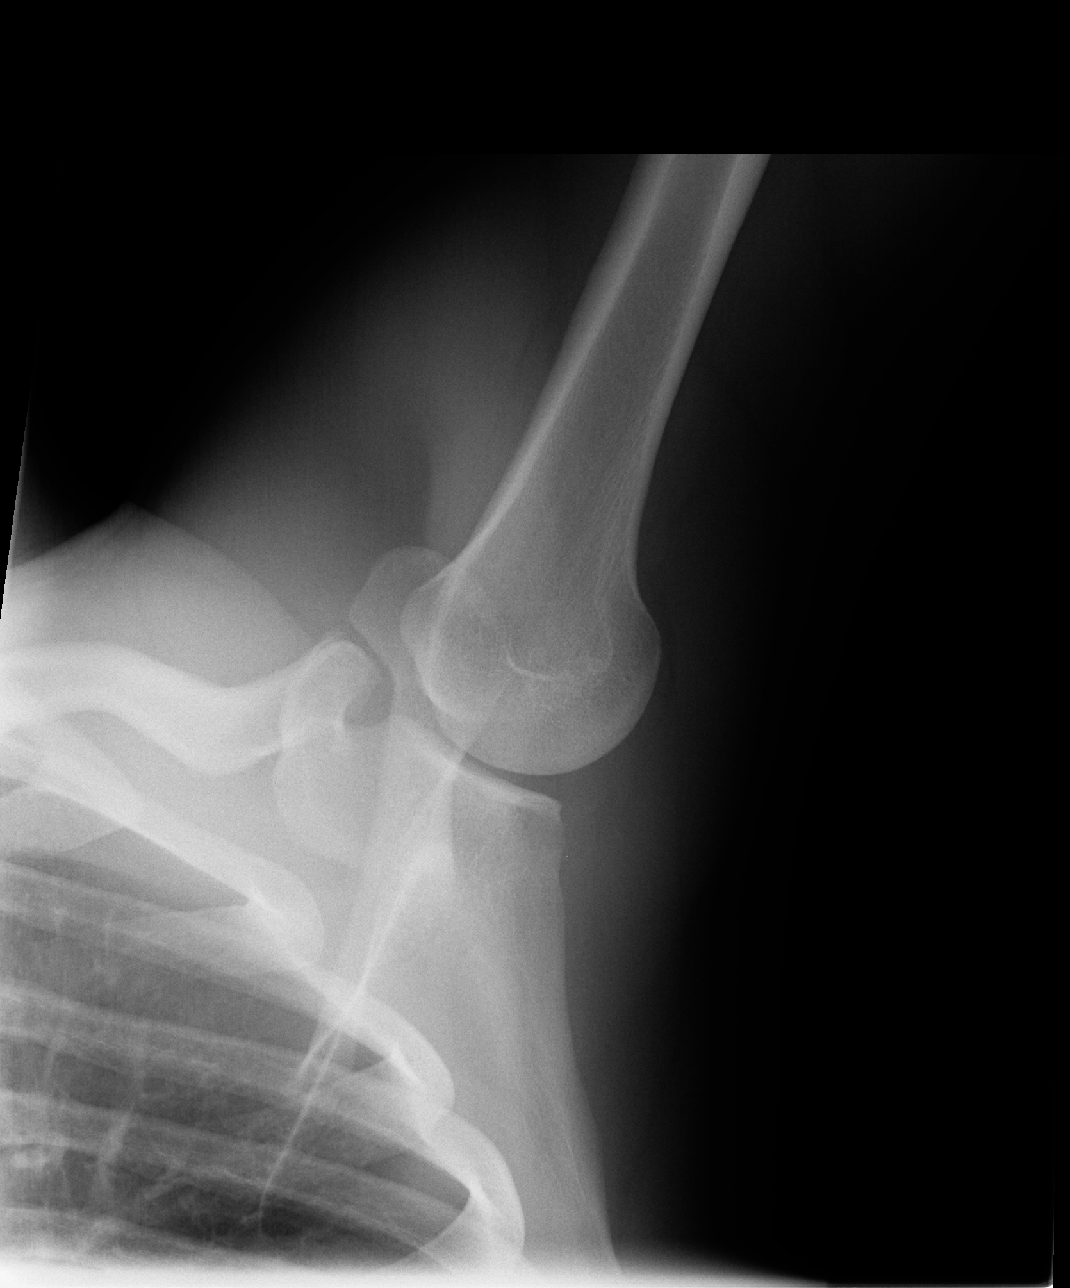

[3 of 3 positions shown; findings below may reference images not displayed]

FINDINGS: There is no evidence of fracture or dislocation. Visualized ribs
appear normal. There is no evidence of arthropathy or other focal
bone abnormality. Soft tissues are unremarkable.
IMPRESSION: Normal left shoulder.

## 2016-03-11 IMAGING — RF DG FLUORO GUIDE NDL PLC/BX
2 series · 2 of 2 positions shown · non-contrast
Comparison: none

CLINICAL DATA: Posterior shoulder dislocation while lifting
weights.

[Series 1: (hospital) · 1 of 1 slices shown (1 of 2)]
[im 1/1]
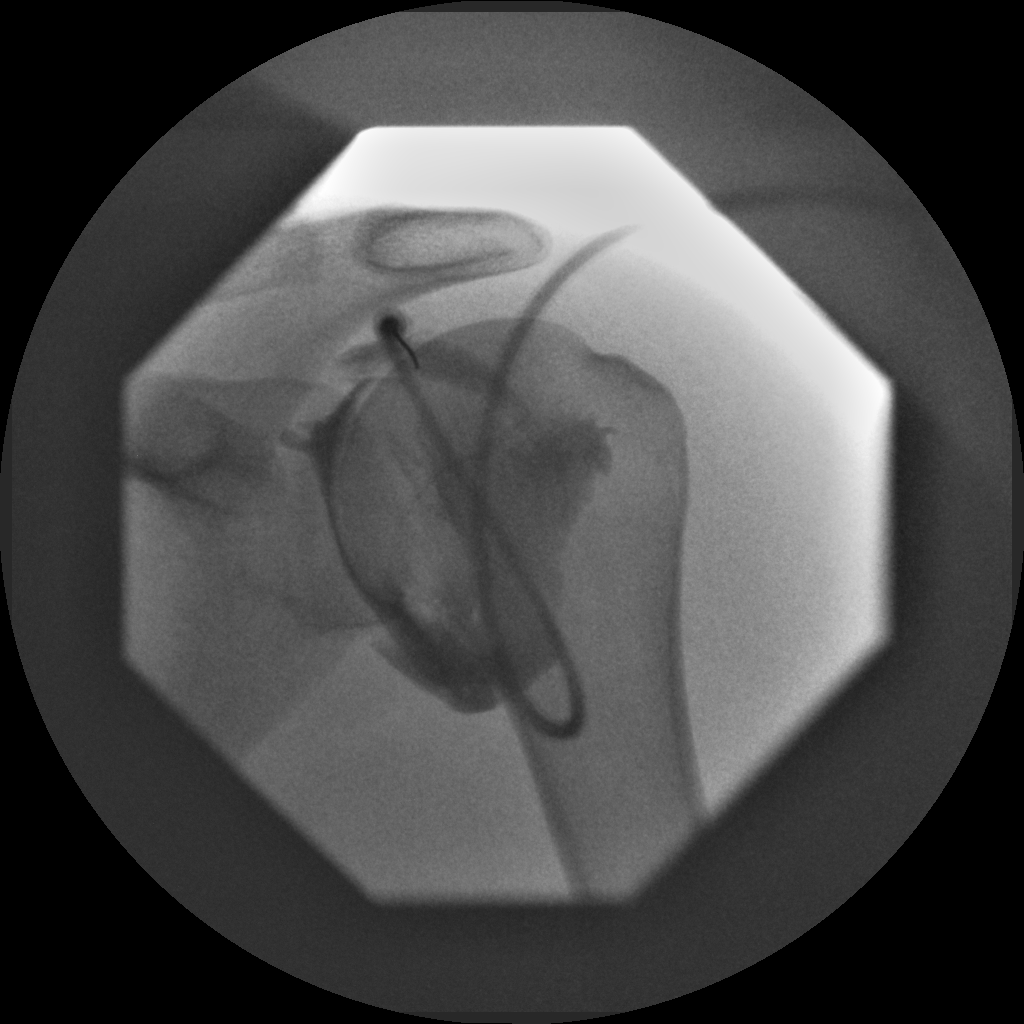

[Series 2: (hospital) · 1 of 1 slices shown (2 of 2)]
[im 1/1]
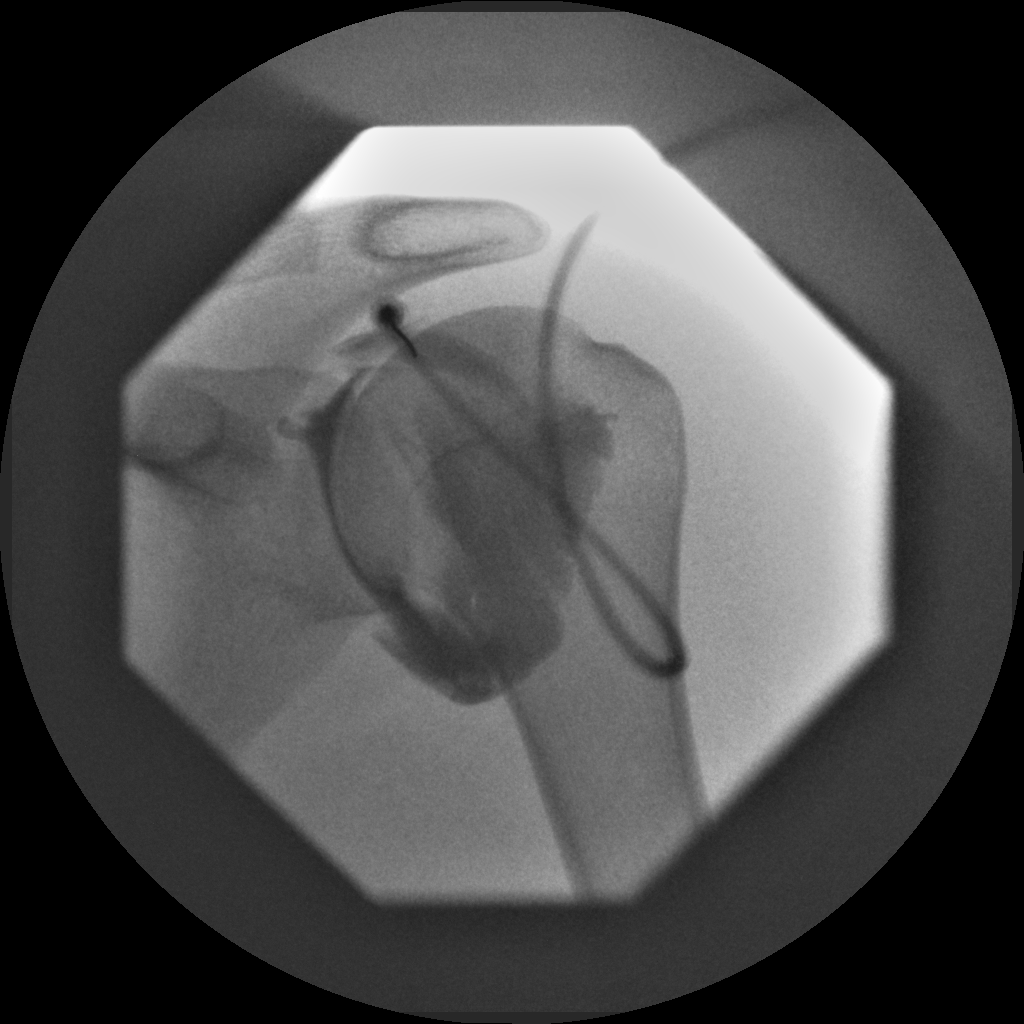

[2 of 2 positions shown; findings below may reference images not displayed]

EXAM:
SHOULDER INJECTION FOR MRI

FLUOROSCOPY TIME:  0 min 40 seconds

PROCEDURE:
After a thorough discussion of risks and benefits of the procedure,
written and oral informed consent was obtained. The consent
discussion included the risk of bleeding, infection and injury to
nerves and adjacent blood vessels. Extra-articular injection was
also a possible risk discussed. Verbal consent was obtained by Dr.
Jumper.

Preliminary localization was performed over the left shoulder. The
area was marked over superior medial anterior humeral head.

After prep and drape in the usual sterile fashion, the skin and
deeper subcutaneous tissues were anesthetized with 1% Lidocaine
without Epinephrine. Under fluoroscopic guidance, a 22 gauge
inch spinal needle was advanced into the joint over the superior
medial anterior humeral head using an anterior approach.
Intra-articular injection of Lidocaine was performed which flowed
freely and subsequently the joint was distended with 13 ml of a
[DATE] dilution of Multihance contrast. The MR arthrogram solution
was as follows: 15 ml of omnipaque 180 contrast agent, 0.1 mL
Multihance, 5 ml of 1% Lidocaine. An end point was felt as well as
the patient experiencing pressure and the injection was
discontinued, the needle removed, and a sterile dressing applied.
The patient was taken to MRI for subsequent imaging.

The patient tolerated the procedure well and there were no
complications.
IMPRESSION: Successful left shoulder fluoroscopically guided injection.
